# Patient Record
Sex: Female | Born: 1980 | Race: Black or African American | Hispanic: No | Marital: Married | Smoking: Never smoker
Health system: Southern US, Community
[De-identification: ages and names within clinical notes are randomized; demographics above are authoritative.]

## PROBLEM LIST (undated history)

## (undated) DIAGNOSIS — J45909 Unspecified asthma, uncomplicated: Secondary | ICD-10-CM

## (undated) DIAGNOSIS — O139 Gestational [pregnancy-induced] hypertension without significant proteinuria, unspecified trimester: Secondary | ICD-10-CM

## (undated) HISTORY — PX: FOOT SURGERY: SHX648

---
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
## 2006-08-08 ENCOUNTER — Other Ambulatory Visit: Admission: RE | Admit: 2006-08-08 | Discharge: 2006-08-08 | Payer: Self-pay | Admitting: Family Medicine

## 2006-11-19 ENCOUNTER — Ambulatory Visit (HOSPITAL_BASED_OUTPATIENT_CLINIC_OR_DEPARTMENT_OTHER): Admission: RE | Admit: 2006-11-19 | Discharge: 2006-11-19 | Payer: Self-pay | Admitting: Orthopedic Surgery

## 2008-04-29 ENCOUNTER — Encounter: Admission: RE | Admit: 2008-04-29 | Discharge: 2008-07-28 | Payer: Self-pay | Admitting: Family Medicine

## 2009-02-02 ENCOUNTER — Other Ambulatory Visit: Admission: RE | Admit: 2009-02-02 | Discharge: 2009-02-02 | Payer: Self-pay | Admitting: Family Medicine

## 2010-05-19 ENCOUNTER — Inpatient Hospital Stay (HOSPITAL_COMMUNITY)
Admission: AD | Admit: 2010-05-19 | Discharge: 2010-05-22 | DRG: 775 | Disposition: A | Discharge status: Home / Self Care | Payer: Medicaid Other | Source: Ambulatory Visit | Attending: Obstetrics and Gynecology | Admitting: Obstetrics and Gynecology

## 2010-05-19 DIAGNOSIS — D689 Coagulation defect, unspecified: Secondary | ICD-10-CM | POA: Diagnosis present

## 2010-05-19 DIAGNOSIS — O9912 Other diseases of the blood and blood-forming organs and certain disorders involving the immune mechanism complicating childbirth: Secondary | ICD-10-CM | POA: Diagnosis present

## 2010-05-19 DIAGNOSIS — O139 Gestational [pregnancy-induced] hypertension without significant proteinuria, unspecified trimester: Secondary | ICD-10-CM | POA: Diagnosis present

## 2010-05-19 DIAGNOSIS — D696 Thrombocytopenia, unspecified: Secondary | ICD-10-CM | POA: Diagnosis present

## 2010-05-19 DIAGNOSIS — O99892 Other specified diseases and conditions complicating childbirth: Secondary | ICD-10-CM | POA: Diagnosis present

## 2010-05-19 DIAGNOSIS — Z22330 Carrier of Group B streptococcus: Secondary | ICD-10-CM

## 2010-05-19 LAB — COMPREHENSIVE METABOLIC PANEL
ALT: 17 U/L (ref 0–35)
AST: 12 U/L (ref 0–37)
Albumin: 2.8 g/dL — ABNORMAL LOW (ref 3.5–5.2)
Alkaline Phosphatase: 111 U/L (ref 39–117)
BUN: 6 mg/dL (ref 6–23)
CO2: 24 mEq/L (ref 19–32)
Calcium: 9 mg/dL (ref 8.4–10.5)
Chloride: 102 mEq/L (ref 96–112)
Creatinine, Ser: 0.49 mg/dL (ref 0.4–1.2)
GFR calc Af Amer: >60 mL/min (ref >60)
GFR calc non Af Amer: >60 mL/min (ref >60)
Glucose, Bld: 103 mg/dL — ABNORMAL HIGH (ref 70–99)
Potassium: 3.6 mEq/L (ref 3.5–5.1)
Sodium: 137 mEq/L (ref 135–145)
Total Bilirubin: 0.5 mg/dL (ref 0.3–1.2)
Total Protein: 5.8 g/dL — ABNORMAL LOW (ref 6.0–8.3)

## 2010-05-19 LAB — CBC
HCT: 37.2 % (ref 36.0–46.0)
Hemoglobin: 13.2 g/dL (ref 12.0–15.0)
MCH: 27.8 pg (ref 26.0–34.0)
MCHC: 35.5 g/dL (ref 30.0–36.0)
MCV: 78.3 fL (ref 78.0–100.0)
Platelets: 96 10*3/uL — ABNORMAL LOW (ref 150–400)
RBC: 4.75 MIL/uL (ref 3.87–5.11)
RDW: 15.8 % — ABNORMAL HIGH (ref 11.5–15.5)
WBC: 11.2 10*3/uL — ABNORMAL HIGH (ref 4.0–10.5)

## 2010-05-19 LAB — URIC ACID: Uric Acid, Serum: 2.6 mg/dL (ref 2.4–7.0)

## 2010-05-19 LAB — LACTATE DEHYDROGENASE: LDH: 180 U/L (ref 94–250)

## 2010-05-20 ENCOUNTER — Other Ambulatory Visit: Payer: Self-pay | Admitting: Obstetrics and Gynecology

## 2010-05-20 LAB — CBC
HCT: 37.6 % (ref 36.0–46.0)
MCH: 27.7 pg (ref 26.0–34.0)
MCV: 78.3 fL (ref 78.0–100.0)
Platelets: 93 10*3/uL — ABNORMAL LOW (ref 150–400)
RDW: 16.1 % — ABNORMAL HIGH (ref 11.5–15.5)

## 2010-05-21 LAB — COMPREHENSIVE METABOLIC PANEL
AST: 17 U/L (ref 0–37)
Albumin: 2.5 g/dL — ABNORMAL LOW (ref 3.5–5.2)
BUN: 5 mg/dL — ABNORMAL LOW (ref 6–23)
CO2: 24 mEq/L (ref 19–32)
Calcium: 8.9 mg/dL (ref 8.4–10.5)
Chloride: 101 mEq/L (ref 96–112)
Creatinine, Ser: <0.47 mg/dL (ref 0.4–1.2)
Total Bilirubin: 0.5 mg/dL (ref 0.3–1.2)

## 2010-05-21 LAB — CBC
MCH: 27.7 pg (ref 26.0–34.0)
MCHC: 35.5 g/dL (ref 30.0–36.0)
MCV: 78.2 fL (ref 78.0–100.0)
Platelets: 94 10*3/uL — ABNORMAL LOW (ref 150–400)
RBC: 4.76 MIL/uL (ref 3.87–5.11)

## 2010-05-21 LAB — URIC ACID: Uric Acid, Serum: 2.3 mg/dL — ABNORMAL LOW (ref 2.4–7.0)

## 2010-05-22 LAB — COMPREHENSIVE METABOLIC PANEL
ALT: 15 U/L (ref 0–35)
AST: 18 U/L (ref 0–37)
Albumin: 2.4 g/dL — ABNORMAL LOW (ref 3.5–5.2)
Alkaline Phosphatase: 77 U/L (ref 39–117)
Chloride: 99 mEq/L (ref 96–112)
Potassium: 3.9 mEq/L (ref 3.5–5.1)
Sodium: 136 mEq/L (ref 135–145)
Total Bilirubin: 0.3 mg/dL (ref 0.3–1.2)
Total Protein: 5.5 g/dL — ABNORMAL LOW (ref 6.0–8.3)

## 2010-05-22 LAB — CBC
MCV: 78.3 fL (ref 78.0–100.0)
Platelets: 109 10*3/uL — ABNORMAL LOW (ref 150–400)
RBC: 4.15 MIL/uL (ref 3.87–5.11)
RDW: 16.1 % — ABNORMAL HIGH (ref 11.5–15.5)
WBC: 17.6 10*3/uL — ABNORMAL HIGH (ref 4.0–10.5)

## 2010-05-23 NOTE — Op Note (Signed)
NAMEMarland Kitchen  CELISA, SCHOENBERG NO.:  [id number]   MEDICAL RECORD NO.:  [id number]          PATIENT TYPE:  AMB   LOCATION:  [facility]                         FACILITY:  [facility]   PHYSICIAN:  Deidre Ala, M.D.    DATE OF BIRTH:  [birth year]   DATE OF PROCEDURE:  11/19/2006  DATE OF DISCHARGE:                               OPERATIVE REPORT   PREOPERATIVE DIAGNOSES:  1. Left foot dorsal proximal interphalangeal joint dislocation fourth      toe, closed.  2. Left fifth toe proximal phalanx fracture with mild to moderate      displacement at phalangeal base.  3. 1.76 cm superficial laceration flexion crease base of left fifth      toe.   POSTOPERATIVE DIAGNOSES:  1. Left foot dorsal proximal interphalangeal joint dislocation fourth      toe, closed.  2. Left fifth toe proximal phalanx fracture with mild to moderate      displacement at phalangeal base.  3. 1.76 cm superficial laceration flexion crease base of left fifth      toe.   PROCEDURE:  1. Attempt at closed with percutaneous pinning then open reduction      internal fixation, left fourth toe dorsal proximal interphalangeal      joint dislocation with pinning.  2. Attempt at closed with percutaneous pinning then open reduction      internal fixation left fifth toe proximal phalanx fracture with      pinning.  3. Closure 1.7-mm laceration base of left fifth toe with single-layer      closure and wound cleansing.  4. Use of intraoperative fluoro.   SURGEON:  Doristine Section, M.D.   ASSISTANT:  Phineas Semen, P.A.   ANESTHESIA:  General with LMA.   CULTURES:  None.   DRAINS:  None.   ESTIMATED BLOOD LOSS:  Minimal.   TOURNIQUET TIME:  Esmarch 1 hour 8 minutes.   PATHOLOGIC FINDINGS AND HISTORY:  Peyson was at home, fell out of bed,  leg was asleep and hit her toe against a leg of a chair.  She sustained  these fractures and dislocations, went to [facility] on  [address] where Dr. Chilton Si  attempted a closed reduction of the fourth  toe with local block and was unable to reduce it.  The laceration was  left open, thoroughly cleansed and she was placed on antibiotics.  She  then came to our office and was set up for surgery today.  At surgery we  got a reduction, but it was very unstable of the left fourth toe and it  was very difficult to pin without opening it under direct vision.  The  same was true of the left fifth toe fracture base and so both of them  were opened to dorsal extensor hood splitting approaches where anatomic  reduction was obtained and pinning carried out under direct vision and  confirmed by C-arm fluoroscopy with essentially anatomic reduction  obtained on the fifth and anatomic retained fifth toe proximal phalanx  and anatomic reduction obtained on the left fourth.  We also  cleansed of  the laceration which was single layer did not penetrate deeply to the  fracture site with 4-0 nylon vertical mattress sutures.  Intraoperative  fluoro was used to confirm positioning.   PROCEDURE:  With adequate anesthesia obtained using LMA technique, 1  gram Ancef given IV prophylaxis and another one at tourniquet let down.  The patient was placed in the supine position, left foot was prepped  from toes to the midcalf in the standard fashion.  After standard  prepping and draping, Esmarch examination was used and left on without  tourniquet on the thigh.  I then attempted closed reduction of the  fourth and pinning retrograde.  I then elected to go ahead with a  longitudinal incision over the PIP joint.  Incision was deepened sharply  with a knife and hemostasis obtained using Bovie electrocoagulator.  Under loupe magnification, the extensor tendon and hood was split  longitudinally.  The dislocation was reduced exactly.  The pin that we  had had retrograde into the distal and middle phalanx was pulled outward  just to the level of the joint.  I slightly flexed the  joint and then  drilled it again retrograde into the head of the proximal phalanx and  just up to the cortex.  This was in an anatomic position and it did hold  the joint into the proximal phalanx under direct vision.  C-arm  fluoroscopy confirmed this.  We then turned attention to the fifth toe  where attempted closed reduction and pinning was carried out.  I then  made a longitudinal incision over the base of the proximal phalanx,  split the extensor tendon, dissected the fracture, then took the pin, a  0.45 K-wire antegrade out the distal portion of the proximal phalanx  fracture, reduced the fracture and then pinned it retrograde through the  base of the proximal phalanx and the metatarsal head.  C-arm fluoroscopy  confirmed positioning under direct vision.  Irrigation was carried out.  Both wounds were then closed with extensor hoods and extensor tendon  split, closed with a running locking 4-0 Vicryl and the skin was closed  with running 4-0 nylon.  The pins were bent and cut and capped.  We then  also placed a intermetatarsal block 3, 4 and 5 with 0.5% Marcaine plain  and close the base of fifth toe laceration with interrupted vertical  mattress sutures of 4-0 nylon.  A bulky sterile compressive forefoot  dressing was applied.  The patient having procedure well was awakened,  taken to recovery room in satisfactory condition where she should  continue her home antibiotics for 5 days.  We will see her back on  Friday for recheck, weightbearing as tolerated on her heel, crutches,  given Percocet for pain.           ______________________________  V. Charlesetta Shanks, M.D.     VEP/MEDQ  D:  11/19/2006  T:  11/20/2006  Job:  [id number]   cc:   Dr. Rodman Key, [facility]

## 2010-05-26 ENCOUNTER — Inpatient Hospital Stay (HOSPITAL_COMMUNITY)
Admission: AD | Admit: 2010-05-26 | Discharge: 2010-05-26 | Disposition: A | Discharge status: Home / Self Care | Payer: Medicaid Other | Source: Ambulatory Visit | Attending: Obstetrics and Gynecology | Admitting: Obstetrics and Gynecology

## 2010-05-26 DIAGNOSIS — O135 Gestational [pregnancy-induced] hypertension without significant proteinuria, complicating the puerperium: Secondary | ICD-10-CM | POA: Insufficient documentation

## 2010-05-26 LAB — CBC
Hemoglobin: 12.6 g/dL (ref 12.0–15.0)
MCHC: 35.3 g/dL (ref 30.0–36.0)
WBC: 10.9 10*3/uL — ABNORMAL HIGH (ref 4.0–10.5)

## 2010-05-26 LAB — COMPREHENSIVE METABOLIC PANEL
ALT: 31 U/L (ref 0–35)
AST: 13 U/L (ref 0–37)
Alkaline Phosphatase: 89 U/L (ref 39–117)
CO2: 27 mEq/L (ref 19–32)
Calcium: 9.2 mg/dL (ref 8.4–10.5)
GFR calc Af Amer: >60 mL/min (ref >60)
GFR calc non Af Amer: >60 mL/min (ref >60)
Potassium: 4 mEq/L (ref 3.5–5.1)
Sodium: 136 mEq/L (ref 135–145)
Total Protein: 5.9 g/dL — ABNORMAL LOW (ref 6.0–8.3)

## 2010-05-26 LAB — URINALYSIS, ROUTINE W REFLEX MICROSCOPIC
Bilirubin Urine: NEGATIVE
Glucose, UA: NEGATIVE mg/dL
Ketones, ur: NEGATIVE mg/dL
Nitrite: NEGATIVE
Specific Gravity, Urine: 1.015 (ref 1.005–1.030)
pH: 7.5 (ref 5.0–8.0)

## 2010-05-26 LAB — URINE MICROSCOPIC-ADD ON

## 2010-05-28 ENCOUNTER — Inpatient Hospital Stay (HOSPITAL_COMMUNITY)
Admission: AD | Admit: 2010-05-28 | Discharge: 2010-05-28 | Disposition: A | Discharge status: Home / Self Care | Payer: Medicaid Other | Source: Ambulatory Visit | Attending: Obstetrics and Gynecology | Admitting: Obstetrics and Gynecology

## 2010-05-28 DIAGNOSIS — O135 Gestational [pregnancy-induced] hypertension without significant proteinuria, complicating the puerperium: Secondary | ICD-10-CM | POA: Insufficient documentation

## 2010-05-28 LAB — URINALYSIS, ROUTINE W REFLEX MICROSCOPIC
Glucose, UA: NEGATIVE mg/dL
Specific Gravity, Urine: 1.02 (ref 1.005–1.030)

## 2010-05-28 LAB — URINE MICROSCOPIC-ADD ON

## 2010-05-29 LAB — URINE CULTURE

## 2010-10-17 LAB — POCT HEMOGLOBIN-HEMACUE: Hemoglobin: 15.5 — ABNORMAL HIGH

## 2010-10-17 LAB — POCT PREGNANCY, URINE
Operator id: 268271
Preg Test, Ur: NEGATIVE

## 2012-04-30 ENCOUNTER — Emergency Department (INDEPENDENT_AMBULATORY_CARE_PROVIDER_SITE_OTHER)
Admission: EM | Admit: 2012-04-30 | Discharge: 2012-04-30 | Disposition: A | Discharge status: Home / Self Care | Payer: BC Managed Care – PPO | Source: Home / Self Care | Attending: Family Medicine | Admitting: Family Medicine

## 2012-04-30 ENCOUNTER — Encounter (HOSPITAL_COMMUNITY): Payer: Self-pay | Admitting: *Deleted

## 2012-04-30 ENCOUNTER — Emergency Department (INDEPENDENT_AMBULATORY_CARE_PROVIDER_SITE_OTHER): Payer: BC Managed Care – PPO

## 2012-04-30 DIAGNOSIS — B349 Viral infection, unspecified: Secondary | ICD-10-CM

## 2012-04-30 DIAGNOSIS — B9789 Other viral agents as the cause of diseases classified elsewhere: Secondary | ICD-10-CM

## 2012-04-30 DIAGNOSIS — J302 Other seasonal allergic rhinitis: Secondary | ICD-10-CM

## 2012-04-30 DIAGNOSIS — J309 Allergic rhinitis, unspecified: Secondary | ICD-10-CM

## 2012-04-30 HISTORY — DX: Gestational (pregnancy-induced) hypertension without significant proteinuria, unspecified trimester: O13.9

## 2012-04-30 HISTORY — DX: Unspecified asthma, uncomplicated: J45.909

## 2012-04-30 MED ORDER — BENZONATATE 100 MG PO CAPS: 100.0000 mg | ORAL_CAPSULE | Freq: Three times a day (TID) | ORAL | Status: DC | PRN

## 2012-04-30 MED ORDER — FLUTICASONE PROPIONATE 50 MCG/ACT NA SUSP: 2.0000 | Freq: Every day | NASAL | Status: DC

## 2012-04-30 NOTE — ED Provider Notes (Signed)
History     CSN: [id number]  Arrival date & time 04/30/12  1041   None     Chief Complaint  Patient presents with  . Cough    (Consider location/radiation/quality/duration/timing/severity/associated sxs/prior treatment) HPI Comments: Pt reports had GI illness 4/18-4/21 which has completely resolved although pt still doesn't feel like eating and isn't drinking enough.  4/21 developed cough and congestion. Pt thinks may be a combination of an illness and seasonal allergies. Isn't taking zyrtec at this time. Cough bothers her the most. Hasn't taken anything for illness today.   Patient is a 32 y.o. female presenting with cough. The history is provided by the patient.  Cough Cough characteristics:  Non-productive and hacking Severity:  Moderate Onset quality:  Gradual Duration:  2 days Timing:  Constant Progression:  Unchanged Chronicity:  New Smoker: no   Relieved by:  Nothing Worsened by:  Lying down Ineffective treatments: otc cold medicine. Associated symptoms: chills, ear fullness, fever, rhinorrhea, sinus congestion and sore throat   Associated symptoms: no chest pain and no wheezing   Associated symptoms comment:  Temp last night was 101   Past Medical History  Diagnosis Date  . Asthma   . Gestational hypertension     Past Surgical History  Procedure Laterality Date  . Foot surgery      History reviewed. No pertinent family history.  History  Substance Use Topics  . Smoking status: Never Smoker   . Smokeless tobacco: Not on file  . Alcohol Use: No    OB History   Grav Para Term Preterm Abortions TAB SAB Ect Mult Living                  Review of Systems  Constitutional: Positive for fever and chills.  HENT: Positive for congestion, sore throat, rhinorrhea, postnasal drip and sinus pressure.        Ear pressure  Respiratory: Positive for cough and chest tightness. Negative for wheezing.   Cardiovascular: Negative for chest pain.  Gastrointestinal:  Negative for nausea, vomiting, abdominal pain, diarrhea and constipation.       None at this time    Allergies  Review of patient's allergies indicates no known allergies.  Home Medications   Current Outpatient Rx  Name  Route  Sig  Dispense  Refill  . Cetirizine HCl (ZYRTEC PO)   Oral   Take by mouth.         . Pseudoeph-Doxylamine-DM-APAP (NYQUIL PO)   Oral   Take by mouth.         . benzonatate (TESSALON) 100 MG capsule   Oral   Take 1 capsule (100 mg total) by mouth 3 (three) times daily as needed for cough.   21 capsule   0   . fluticasone (FLONASE) 50 MCG/ACT nasal spray   Nasal   Place 2 sprays into the nose daily.   16 g   2     BP 117/75  Pulse 107  Temp(Src) 98.3 F (36.8 C) (Oral)  Resp 18  SpO2 100%  Physical Exam  Constitutional: She appears well-developed and well-nourished.  HENT:  Right Ear: Tympanic membrane, external ear and ear canal normal.  Left Ear: Tympanic membrane, external ear and ear canal normal.  Nose: Mucosal edema and rhinorrhea present. Right sinus exhibits maxillary sinus tenderness and frontal sinus tenderness. Left sinus exhibits maxillary sinus tenderness and frontal sinus tenderness.  Mouth/Throat: Oropharynx is clear and moist and mucous membranes are normal.  Cardiovascular: Regular rhythm.  Tachycardia present.   Pulmonary/Chest: Effort normal and breath sounds normal. No respiratory distress. She has no decreased breath sounds. She has no wheezes. She has no rhonchi. She has no rales.  Frequent coughing  Lymphadenopathy:       Head (right side): No submental, no submandibular and no tonsillar adenopathy present.       Head (left side): No submental, no submandibular and no tonsillar adenopathy present.    She has no cervical adenopathy.    ED Course  Procedures (including critical care time)  Labs Reviewed - No data to display Dg Chest 2 View  04/30/2012  *RADIOLOGY REPORT*  Clinical Data: Coughing.  Congestion.   Short of breath.  Fever.  CHEST - 2 VIEW  Comparison: None.  Findings:  Cardiopericardial silhouette within normal limits. Mediastinal contours normal. Trachea midline.  No airspace disease or effusion.  IMPRESSION: No active cardiopulmonary disease.   Original Report Authenticated By: Andreas Newport, M.D.      1. Seasonal allergies   2. Viral infection       MDM  rx flonase, advised to restart zyrtec, use saline spray. rx tessalon for cough. Work note.         Cathlyn Parsons, NP 04/30/12 [phone]

## 2012-04-30 NOTE — ED Notes (Signed)
Pt  Reports  Symptoms   Of  Cough     Productive     At times   -    As  Well  As  Congestion    -    Symptoms  Began  About  3  Days  Ago        -        She  Reports  Had    Symptoms  Of       Gi    Issues  sev  Days  Ago  But  That is  bertter   -  She  Has  A  History  Of  Asthma  In past   But  Does  Not  Use  Any  Inhalers      She  Is  At this  Time  Sitting upright on  Exam table  Speaking in  Complete  sentances

## 2012-05-01 NOTE — ED Provider Notes (Signed)
Medical screening examination/treatment/procedure(s) were performed by non-physician practitioner and as supervising physician I was immediately available for consultation/collaboration.   MORENO-COLL,Zorion Nims; MD  Estefany Goebel Moreno-Coll, MD 05/01/12 0924 

## 2012-09-27 ENCOUNTER — Emergency Department (HOSPITAL_COMMUNITY)
Admission: EM | Admit: 2012-09-27 | Discharge: 2012-09-27 | Disposition: A | Discharge status: Home / Self Care | Payer: Self-pay | Source: Home / Self Care

## 2012-09-27 ENCOUNTER — Encounter (HOSPITAL_COMMUNITY): Payer: Self-pay | Admitting: Emergency Medicine

## 2012-09-27 DIAGNOSIS — H5711 Ocular pain, right eye: Secondary | ICD-10-CM

## 2012-09-27 DIAGNOSIS — H53451 Other localized visual field defect, right eye: Secondary | ICD-10-CM | POA: Diagnosis present

## 2012-09-27 DIAGNOSIS — H547 Unspecified visual loss: Secondary | ICD-10-CM

## 2012-09-27 MED ORDER — KETOROLAC TROMETHAMINE 0.5 % OP SOLN: 1.0000 [drp] | Freq: Four times a day (QID) | OPHTHALMIC | Status: DC

## 2012-09-27 NOTE — ED Provider Notes (Signed)
CSN: [id number]     Arrival date & time 09/27/12  1115 History   None    Chief Complaint  Patient presents with  . Eye Problem   (Consider location/radiation/quality/duration/timing/severity/associated sxs/prior Treatment) HPI Comments: Patient presents today with right eye redness, pain and decreased vision. Onset Tuesday with "irritation" in the eye. No known exposures or FB to the eye is noted. Over the days to follow she noticed worsening pain, irritation and a "film" over the right eye that has progressed. She also notes some drainage to the right eye with "Crust" this am. Otherwise no fatigue, fever, chills, cough, congestion or exposures to illness.    Patient is a 32 y.o. female presenting with eye problem. The history is provided by the patient.  Eye Problem   Past Medical History  Diagnosis Date  . Asthma   . Gestational hypertension    Past Surgical History  Procedure Laterality Date  . Foot surgery     History reviewed. No pertinent family history. History  Substance Use Topics  . Smoking status: Never Smoker   . Smokeless tobacco: Not on file  . Alcohol Use: No   OB History   Grav Para Term Preterm Abortions TAB SAB Ect Mult Living                 Review of Systems  All other systems reviewed and are negative.    Allergies  Review of patient's allergies indicates no known allergies.  Home Medications   Current Outpatient Rx  Name  Route  Sig  Dispense  Refill  . benzonatate (TESSALON) 100 MG capsule   Oral   Take 1 capsule (100 mg total) by mouth 3 (three) times daily as needed for cough.   21 capsule   0   . Cetirizine HCl (ZYRTEC PO)   Oral   Take by mouth.         . fluticasone (FLONASE) 50 MCG/ACT nasal spray   Nasal   Place 2 sprays into the nose daily.   16 g   2   . ketorolac (ACULAR) 0.5 % ophthalmic solution   Right Eye   Place 1 drop into the right eye every 6 (six) hours.   5 mL   0   . Pseudoeph-Doxylamine-DM-APAP  (NYQUIL PO)   Oral   Take by mouth.          BP 139/88  Pulse 105  Temp(Src) 98.5 F (36.9 C) (Oral)  Resp 20  SpO2 98%  LMP 09/12/2012 Physical Exam  Nursing note and vitals reviewed. Constitutional: She appears well-developed and well-nourished. No distress.  HENT:  Head: Normocephalic and atraumatic.  Eyes:  Right pupil sluggish to light, sclera injection, no abrasion is observed. Conjunctiva appears inflammed but not beefy red. No drainage is noted. Further opthalmic evaluation difficult to evaluate.   Skin: Skin is warm and dry.  Psychiatric: Her behavior is normal.    ED Course  Procedures (including critical care time) Labs Review Labs Reviewed - No data to display Imaging Review No results found.  MDM   1. Eye pain, right   2. Decreased vision   Discussed with Dr. Artis Flock and then spoke with Dr. Delaney Meigs. He will contact patient later today with an appt. In the interim Will use Ketoralac drops 3-4 times daily. Patient is educated and will wait for f/u.     Azucena Fallen, PA-C 09/27/12 1315

## 2012-09-27 NOTE — ED Notes (Signed)
C/o right eye irritation which she first noticed on Tuesday.  Allergy medication and visine used but no relief.   States i started itching and burning.   Warm cloth was used.

## 2012-10-01 NOTE — ED Provider Notes (Signed)
Medical screening examination/treatment/procedure(s) were performed by resident physician or non-physician practitioner and as supervising physician I was immediately available for consultation/collaboration.   Barkley Bruns MD.   Linna Hoff, MD 10/01/12 [phone]

## 2014-03-19 IMAGING — CR DG CHEST 2V
1 series · 1 of 1 positions shown · non-contrast
Comparison: None.

CLINICAL DATA: Coughing.  Congestion.  Short of breath.  Fever.

CHEST - 2 VIEW

[view not recorded]
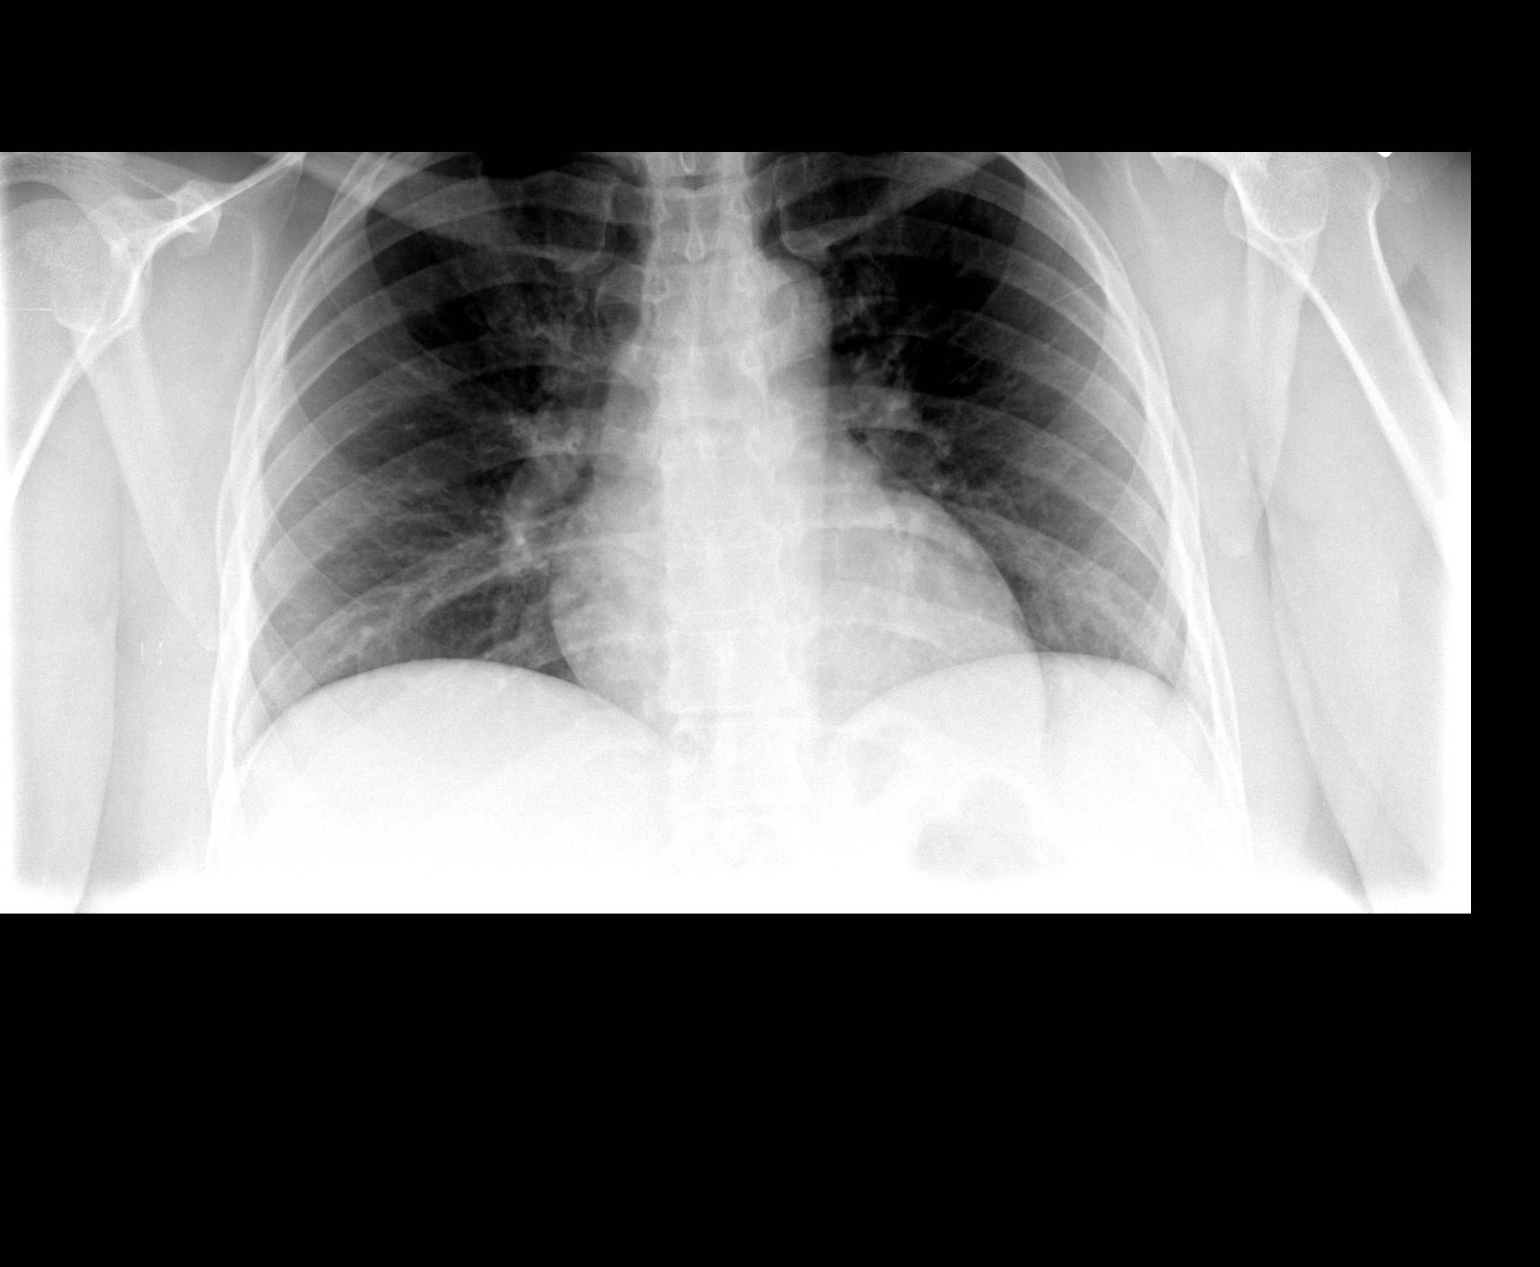

[1 of 1 positions shown; findings below may reference images not displayed]

FINDINGS: Cardiopericardial silhouette within normal limits.
Mediastinal contours normal. Trachea midline.  No airspace disease
or effusion.
IMPRESSION: No active cardiopulmonary disease.

## 2014-07-26 ENCOUNTER — Encounter (HOSPITAL_COMMUNITY): Payer: Self-pay | Admitting: Emergency Medicine

## 2014-07-26 ENCOUNTER — Emergency Department (HOSPITAL_COMMUNITY)
Admission: EM | Admit: 2014-07-26 | Discharge: 2014-07-26 | Disposition: A | Discharge status: Home / Self Care | Payer: Self-pay | Attending: Emergency Medicine | Admitting: Emergency Medicine

## 2014-07-26 DIAGNOSIS — J45909 Unspecified asthma, uncomplicated: Secondary | ICD-10-CM | POA: Insufficient documentation

## 2014-07-26 DIAGNOSIS — Z7951 Long term (current) use of inhaled steroids: Secondary | ICD-10-CM | POA: Insufficient documentation

## 2014-07-26 DIAGNOSIS — S4992XA Unspecified injury of left shoulder and upper arm, initial encounter: Secondary | ICD-10-CM | POA: Insufficient documentation

## 2014-07-26 DIAGNOSIS — S76119A Strain of unspecified quadriceps muscle, fascia and tendon, initial encounter: Secondary | ICD-10-CM

## 2014-07-26 DIAGNOSIS — Z8679 Personal history of other diseases of the circulatory system: Secondary | ICD-10-CM | POA: Insufficient documentation

## 2014-07-26 DIAGNOSIS — S76111A Strain of right quadriceps muscle, fascia and tendon, initial encounter: Secondary | ICD-10-CM | POA: Insufficient documentation

## 2014-07-26 DIAGNOSIS — S76112A Strain of left quadriceps muscle, fascia and tendon, initial encounter: Secondary | ICD-10-CM | POA: Insufficient documentation

## 2014-07-26 DIAGNOSIS — Y998 Other external cause status: Secondary | ICD-10-CM | POA: Insufficient documentation

## 2014-07-26 DIAGNOSIS — Y9389 Activity, other specified: Secondary | ICD-10-CM | POA: Insufficient documentation

## 2014-07-26 DIAGNOSIS — Y92410 Unspecified street and highway as the place of occurrence of the external cause: Secondary | ICD-10-CM | POA: Insufficient documentation

## 2014-07-26 MED ORDER — NAPROXEN 500 MG PO TABS: 500.0000 mg | ORAL_TABLET | Freq: Two times a day (BID) | ORAL | Status: DC

## 2014-07-26 MED ORDER — METHOCARBAMOL 500 MG PO TABS: 500.0000 mg | ORAL_TABLET | Freq: Two times a day (BID) | ORAL | Status: DC

## 2014-07-26 MED ORDER — TRAMADOL HCL 50 MG PO TABS
50.0000 mg | ORAL_TABLET | Freq: Four times a day (QID) | ORAL | Status: DC | PRN
Start: 2014-07-26 — End: 2021-07-08

## 2014-07-26 NOTE — ED Notes (Signed)
Declined W/C at D/C and was escorted to lobby by RN. 
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
## 2015-06-13 ENCOUNTER — Other Ambulatory Visit (HOSPITAL_COMMUNITY)
Admission: RE | Admit: 2015-06-13 | Discharge: 2015-06-13 | Disposition: A | Discharge status: Home / Self Care | Payer: No Typology Code available for payment source | Source: Ambulatory Visit | Attending: Obstetrics and Gynecology | Admitting: Obstetrics and Gynecology

## 2015-06-13 ENCOUNTER — Other Ambulatory Visit: Payer: Self-pay | Admitting: Obstetrics and Gynecology

## 2015-06-13 DIAGNOSIS — Z01419 Encounter for gynecological examination (general) (routine) without abnormal findings: Secondary | ICD-10-CM | POA: Insufficient documentation

## 2015-06-13 DIAGNOSIS — Z1151 Encounter for screening for human papillomavirus (HPV): Secondary | ICD-10-CM | POA: Insufficient documentation

## 2015-06-14 LAB — CYTOLOGY - PAP

## 2019-07-27 ENCOUNTER — Ambulatory Visit: Payer: Self-pay | Attending: Internal Medicine

## 2019-07-27 DIAGNOSIS — Z23 Encounter for immunization: Secondary | ICD-10-CM

## 2019-07-27 NOTE — Progress Notes (Signed)
   Covid-[facility] Vaccination [facility]  Name:  Veronica Schmitt    MRN: [id number] DOB: Oct 10, 1980  07/27/2019  Veronica Schmitt was observed post Covid-19 immunization for 15 minutes without incident. She was provided with Vaccine Information Sheet and instruction to access the V-Safe system.   Veronica Schmitt was instructed to call 911 with any severe reactions post vaccine: Marland Kitchen Difficulty breathing  . Swelling of face and throat  . A fast heartbeat  . A bad rash all over body  . Dizziness and weakness   Immunizations Administered    Name Date Dose VIS Date Route   Pfizer COVID-19 Vaccine 07/27/2019  4:57 PM 0.3 mL 03/04/2018 Intramuscular   Manufacturer: [employer], [employer]   Lot: [id number]   NDC: [address]-9

## 2019-08-18 ENCOUNTER — Ambulatory Visit: Payer: Self-pay | Attending: Internal Medicine

## 2019-08-18 DIAGNOSIS — Z23 Encounter for immunization: Secondary | ICD-10-CM

## 2019-08-18 NOTE — Progress Notes (Signed)
° °  Covid-19 Vaccination Clinic  Name:  Veronica Schmitt    MRN: [id number] DOB: 05/07/1980  08/18/2019  Veronica Schmitt was observed post Covid-19 immunization for 15 minutes without incident. She was provided with Vaccine Information Sheet and instruction to access the V-Safe system.   Veronica Schmitt was instructed to call 911 with any severe reactions post vaccine:  Difficulty breathing   Swelling of face and throat   A fast heartbeat   A bad rash all over body   Dizziness and weakness   Immunizations Administered    Name Date Dose VIS Date Route   Pfizer COVID-19 Vaccine 08/18/2019  9:25 AM 0.3 mL 03/04/2018 Intramuscular   Manufacturer: [employer], [employer]   Lot: [id number]   NDC: [address]-9

## 2019-12-25 DIAGNOSIS — H52223 Regular astigmatism, bilateral: Secondary | ICD-10-CM | POA: Diagnosis not present

## 2021-07-08 ENCOUNTER — Ambulatory Visit
Admission: EM | Admit: 2021-07-08 | Discharge: 2021-07-08 | Disposition: A | Discharge status: Home / Self Care | Payer: BC Managed Care – PPO | Attending: Urgent Care | Admitting: Urgent Care

## 2021-07-08 ENCOUNTER — Encounter: Payer: Self-pay | Admitting: Emergency Medicine

## 2021-07-08 DIAGNOSIS — J209 Acute bronchitis, unspecified: Secondary | ICD-10-CM

## 2021-07-08 DIAGNOSIS — H1013 Acute atopic conjunctivitis, bilateral: Secondary | ICD-10-CM

## 2021-07-08 DIAGNOSIS — J3081 Allergic rhinitis due to animal (cat) (dog) hair and dander: Secondary | ICD-10-CM

## 2021-07-08 MED ORDER — MONTELUKAST SODIUM 10 MG PO TABS: 10.0000 mg | ORAL_TABLET | Freq: Every day | ORAL | 0 refills | Status: DC

## 2021-07-08 MED ORDER — PREDNISONE 10 MG (21) PO TBPK: ORAL_TABLET | Freq: Every day | ORAL | 0 refills | Status: DC

## 2021-07-08 MED ORDER — ALBUTEROL SULFATE HFA 108 (90 BASE) MCG/ACT IN AERS: 1.0000 | INHALATION_SPRAY | Freq: Four times a day (QID) | RESPIRATORY_TRACT | 0 refills | Status: AC | PRN

## 2021-07-08 MED ORDER — PAZEO 0.7 % OP SOLN: 1.0000 [drp] | Freq: Every day | OPHTHALMIC | 0 refills | Status: DC

## 2021-07-08 MED ORDER — LEVOCETIRIZINE DIHYDROCHLORIDE 5 MG PO TABS: 5.0000 mg | ORAL_TABLET | Freq: Every evening | ORAL | 0 refills | Status: DC

## 2021-07-08 NOTE — ED Triage Notes (Signed)
Pt here with possible allergy to a new dog. Presents with productive cough, sore throat, some SOB, eye drainage x 1 week (same day pt got dog). Pt has had reactions to dogs before.

## 2021-07-08 NOTE — Discharge Instructions (Addendum)
Your symptoms are related to bronchitis.  This can be viral in nature, but may also be related to the dog. Please start taking 2 puffs of albuterol every 6 hours as needed for cough and wheezing.  Stop your over-the-counter Zyrtec, switch to Xyzal nightly. Start taking montelukast nightly. Start a prednisone taper as prescribed, take in the morning to prevent insomnia at night. Use the eye drops once daily as needed for irritation. Please only take your Mucinex if it is plain guaifenesin.  If it has Mucinex DM, or dextromethorphan or Sudafed inside, please stop it. If your symptoms return after completion of the medications, consider rehoming the dog or allergy treatments. Should you develop any worsening shortness of breath, fever, or new symptoms, return to clinic.

## 2021-07-08 NOTE — ED Provider Notes (Signed)
[facility]    CSN: [id number] Arrival date & time: 07/08/21  1015      History   Chief Complaint Chief Complaint  Patient presents with   Allergic Reaction   Sore Throat   Shortness of Breath   Eye Problem   Nasal Congestion   Cough    HPI Yeraldi Fidler is a 41 y.o. female.   Pleasant 41yo female presents today with concerns of sore throat, cough, chest congestion, nasal congestion, runny eyes, sneezing and thick mucous production since one week ago. 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 Just purchased a hypoallergenic dog last week when all symptoms started. She has been taking OTC zyrtec, flonase, and cough/cold preparations without any relief. States she is coughing up white and green phlegm, no blood. No fevers. Does have prior hx of asthma, states she is feeling tight in the chest. C/o itchy draining eyes that were crusted this morning. Denies throat swelling or dysphagia. Denies CP or palps.   Allergic Reaction Sore Throat Associated symptoms include shortness of breath.  Shortness of Breath Associated symptoms: cough   Eye Problem Cough Associated symptoms: shortness of breath     Past Medical History:  Diagnosis Date   Asthma    Gestational hypertension     Patient Active Problem List   Diagnosis Date Noted   Decreased peripheral vision of right eye 09/27/2012    Past Surgical History:  Procedure Laterality Date   FOOT SURGERY      OB History   No obstetric history on file.      Home Medications    Prior to Admission medications   Medication Sig Start Date End Date Taking? Authorizing Provider  albuterol (VENTOLIN HFA) 108 (90 Base) MCG/ACT inhaler Inhale 1-2 puffs into the lungs every 6 (six) hours as needed for wheezing or shortness of breath. 07/08/21  Yes Alvia Jablonski L, PA  levocetirizine (XYZAL) 5 MG tablet Take 1 tablet (5 mg total) by mouth every evening. 07/08/21  Yes Evanne Matsunaga L, PA  montelukast (SINGULAIR) 10 MG  tablet Take 1 tablet (10 mg total) by mouth at bedtime. 07/08/21  Yes Ilaisaane Marts L, PA  Olopatadine HCl (PAZEO) 0.7 % SOLN Apply 1 drop to eye daily. 07/08/21  Yes Lesta Limbert L, PA  predniSONE (STERAPRED UNI-PAK 21 TAB) 10 MG (21) TBPK tablet Take by mouth daily. Take 6 tabs by mouth daily  for 1 days, then 5 tabs for 1 days, then 4 tabs for 1 days, then 3 tabs for 1 days, 2 tabs for 1 days, then 1 tab by mouth daily for 1 days 07/08/21  Yes Ashtyn Freilich L, PA  fluticasone (FLONASE) 50 MCG/ACT nasal spray Place 2 sprays into the nose daily. 04/30/12   Cathlyn Parsons, NP  ketorolac (ACULAR) 0.5 % ophthalmic solution Place 1 drop into the right eye every 6 (six) hours. 09/27/12   Riki Sheer, PA-C    Family History History reviewed. No pertinent family history.  Social History Social History   Tobacco Use   Smoking status: Never  Substance Use Topics   Alcohol use: No     Allergies   Patient has no known allergies.   Review of Systems Review of Systems  Respiratory:  Positive for cough and shortness of breath.   As per HPI   Physical Exam Triage Vital Signs ED Triage Vitals  Enc Vitals Group     BP 07/08/21 1029 137/90     Pulse Rate  07/08/21 1029 94     Resp 07/08/21 1029 20     Temp 07/08/21 1029 98.6 F (37 C)     Temp src --      SpO2 07/08/21 1029 98 %     Weight --      Height --      Head Circumference --      Peak Flow --      Pain Score 07/08/21 1032 0     Pain Loc --      Pain Edu? --      Excl. in GC? --    No data found.  Updated Vital Signs BP 137/90   Pulse 94   Temp 98.6 F (37 C)   Resp 20   SpO2 98%   Visual Acuity Right Eye Distance:   Left Eye Distance:   Bilateral Distance:    Right Eye Near:   Left Eye Near:    Bilateral Near:     Physical Exam Vitals and nursing note reviewed.  Constitutional:      General: She is not in acute distress.    Appearance: She is well-developed and normal weight. She is not  toxic-appearing or diaphoretic.  HENT:     Head: Normocephalic and atraumatic.     Right Ear: Ear canal normal. No drainage, swelling or tenderness. A middle ear effusion is present. Tympanic membrane is not erythematous.     Left Ear: Ear canal normal. No drainage, swelling or tenderness. A middle ear effusion is present. Tympanic membrane is not erythematous.     Nose: Congestion and rhinorrhea present.     Right Turbinates: Swollen.     Left Turbinates: Swollen.     Right Sinus: No maxillary sinus tenderness or frontal sinus tenderness.     Left Sinus: No maxillary sinus tenderness or frontal sinus tenderness.     Mouth/Throat:     Mouth: Mucous membranes are moist. No oral lesions.     Pharynx: Oropharynx is clear. Uvula midline. No pharyngeal swelling, oropharyngeal exudate, posterior oropharyngeal erythema or uvula swelling.     Tonsils: No tonsillar exudate or tonsillar abscesses.  Eyes:     General: Lids are everted, no foreign bodies appreciated. No scleral icterus.       Right eye: No foreign body or discharge.        Left eye: No foreign body or discharge.     Conjunctiva/sclera:     Right eye: Right conjunctiva is injected. No chemosis, exudate or hemorrhage.    Left eye: Left conjunctiva is injected. No chemosis, exudate or hemorrhage.    Comments: Clear, tearing  Cardiovascular:     Rate and Rhythm: Normal rate and regular rhythm.     Pulses: Normal pulses.     Heart sounds: Normal heart sounds. No murmur heard. Pulmonary:     Effort: Pulmonary effort is normal. No respiratory distress.     Breath sounds: No stridor. Wheezing (minimal bibasilar) present. No rhonchi or rales.  Chest:     Chest wall: No tenderness.  Musculoskeletal:     Cervical back: Normal range of motion and neck supple. No rigidity or tenderness.  Lymphadenopathy:     Cervical: No cervical adenopathy.  Skin:    General: Skin is warm and dry.     Capillary Refill: Capillary refill takes less than 2  seconds.     Coloration: Skin is not jaundiced.     Findings: No bruising, erythema or rash.  Neurological:  General: No focal deficit present.     Mental Status: She is alert.  Psychiatric:        Mood and Affect: Mood normal.        Behavior: Behavior normal.      UC Treatments / Results  Labs (all labs ordered are listed, but only abnormal results are displayed) Labs Reviewed - No data to display  EKG   Radiology No results found.  Procedures Procedures (including critical care time)  Medications Ordered in UC Medications - No data to display  Initial Impression / Assessment and Plan / UC Course  I have reviewed the triage vital signs and the nursing notes.  Pertinent labs & imaging results that were available during my care of the patient were reviewed by me and considered in my medical decision making (see chart for details).     Acute bronchitis - vs reactive airway disease. Pt does have known hx of asthma. Will start montelukast and ventolin. Pred dose pack called in.  Allergic rhinitis - flonase. Consider sinus flushes. Stop cetirizine and switch to xyzal Allergic conjunctivitis - pazeo eye drops as needed.  Final Clinical Impressions(s) / UC Diagnoses   Final diagnoses:  Acute bronchitis, unspecified organism  Allergic rhinitis due to animal hair and dander  Allergic conjunctivitis of both eyes     Discharge Instructions      Your symptoms are related to bronchitis.  This can be viral in nature, but may also be related to the dog. Please start taking 2 puffs of albuterol every 6 hours as needed for cough and wheezing.  Stop your over-the-counter Zyrtec, switch to Xyzal nightly. Start taking montelukast nightly. Start a prednisone taper as prescribed, take in the morning to prevent insomnia at night. Use the eye drops once daily as needed for irritation. Please only take your Mucinex if it is plain guaifenesin.  If it has Mucinex DM, or  dextromethorphan or Sudafed inside, please stop it. If your symptoms return after completion of the medications, consider rehoming the dog or allergy treatments. Should you develop any worsening shortness of breath, fever, or new symptoms, return to clinic.      ED Prescriptions     Medication Sig Dispense Auth. Provider   levocetirizine (XYZAL) 5 MG tablet Take 1 tablet (5 mg total) by mouth every evening. 14 tablet Seriah Brotzman L, PA   predniSONE (STERAPRED UNI-PAK 21 TAB) 10 MG (21) TBPK tablet Take by mouth daily. Take 6 tabs by mouth daily  for 1 days, then 5 tabs for 1 days, then 4 tabs for 1 days, then 3 tabs for 1 days, 2 tabs for 1 days, then 1 tab by mouth daily for 1 days 21 tablet Kimberlie Csaszar L, PA   albuterol (VENTOLIN HFA) 108 (90 Base) MCG/ACT inhaler Inhale 1-2 puffs into the lungs every 6 (six) hours as needed for wheezing or shortness of breath. 8 g Sunya Humbarger L, PA   montelukast (SINGULAIR) 10 MG tablet Take 1 tablet (10 mg total) by mouth at bedtime. 30 tablet Deneisha Dade L, PA   Olopatadine HCl (PAZEO) 0.7 % SOLN Apply 1 drop to eye daily. 2.5 mL Marvine Encalade L, PA      PDMP not reviewed this encounter.   Maretta Bees, [place] 07/08/21 1224

## 2021-09-03 NOTE — Progress Notes (Unsigned)
New Patient Note  RE: [place]aka MRN: [id number] DOB: Sep 11, 1980 Date of Office Visit: 09/04/2021  Consult requested by: Daisy Floro, MD Primary care provider: Daisy Floro, MD  Chief Complaint: No chief complaint on file.  History of Present Illness: I had the pleasure of seeing Geralda Jones-Pough for initial evaluation at the [facility] on 09/03/2021. She is a 41 y.o. female, who is referred here by Daisy Floro, MD for the evaluation of ***.  She reports symptoms of ***. Symptoms have been going on for *** years. The symptoms are present *** all year around with worsening in ***. Other triggers include exposure to ***. Anosmia: ***. Headache: ***. She has used *** with ***fair improvement in symptoms. Sinus infections: ***. Previous work up includes: ***. Previous ENT evaluation: ***. Previous sinus imaging: ***. History of nasal polyps: ***. Last eye exam: ***. History of reflux: ***.  Assessment and Plan: Casandra is a 41 y.o. female with: No problem-specific Assessment & Plan notes found for this encounter.  No follow-ups on file.  No orders of the defined types were placed in this encounter.  Lab Orders  No laboratory test(s) ordered today    Other allergy screening: Asthma: {Blank single:19197::"yes","no"} Rhino conjunctivitis: {Blank single:19197::"yes","no"} Food allergy: {Blank single:19197::"yes","no"} Medication allergy: {Blank single:19197::"yes","no"} Hymenoptera allergy: {Blank single:19197::"yes","no"} Urticaria: {Blank single:19197::"yes","no"} Eczema:{Blank single:19197::"yes","no"} History of recurrent infections suggestive of immunodeficency: {Blank single:19197::"yes","no"}  Diagnostics: Spirometry:  Tracings reviewed. Her effort: {Blank single:19197::"Good reproducible efforts.","It was hard to get consistent efforts and there is a question as to whether this reflects a maximal maneuver.","Poor  effort, data can not be interpreted."} FVC: ***L FEV1: ***L, ***% predicted FEV1/FVC ratio: ***% Interpretation: {Blank single:19197::"Spirometry consistent with mild obstructive disease","Spirometry consistent with moderate obstructive disease","Spirometry consistent with severe obstructive disease","Spirometry consistent with possible restrictive disease","Spirometry consistent with mixed obstructive and restrictive disease","Spirometry uninterpretable due to technique","Spirometry consistent with normal pattern","No overt abnormalities noted given today's efforts"}.  Please see scanned spirometry results for details.  Skin Testing: {Blank single:19197::"Select foods","Environmental allergy panel","Environmental allergy panel and select foods","Food allergy panel","None","Deferred due to recent antihistamines use"}. *** Results discussed with patient/family.   Past Medical History: Patient Active Problem List   Diagnosis Date Noted  . Decreased peripheral vision of right eye 09/27/2012   Past Medical History:  Diagnosis Date  . Asthma   . Gestational hypertension    Past Surgical History: Past Surgical History:  Procedure Laterality Date  . FOOT SURGERY     Medication List:  Current Outpatient Medications  Medication Sig Dispense Refill  . albuterol (VENTOLIN HFA) 108 (90 Base) MCG/ACT inhaler Inhale 1-2 puffs into the lungs every 6 (six) hours as needed for wheezing or shortness of breath. 8 g 0  . fluticasone (FLONASE) 50 MCG/ACT nasal spray Place 2 sprays into the nose daily. 16 g 2  . ketorolac (ACULAR) 0.5 % ophthalmic solution Place 1 drop into the right eye every 6 (six) hours. 5 mL 0  . levocetirizine (XYZAL) 5 MG tablet Take 1 tablet (5 mg total) by mouth every evening. 14 tablet 0  . montelukast (SINGULAIR) 10 MG tablet Take 1 tablet (10 mg total) by mouth at bedtime. 30 tablet 0  . Olopatadine HCl (PAZEO) 0.7 % SOLN Apply 1 drop to eye daily. 2.5 mL 0  . predniSONE  (STERAPRED UNI-PAK 21 TAB) 10 MG (21) TBPK tablet Take by mouth daily. Take 6 tabs by mouth daily  for 1 days, then 5 tabs for  1 days, then 4 tabs for 1 days, then 3 tabs for 1 days, 2 tabs for 1 days, then 1 tab by mouth daily for 1 days 21 tablet 0   No current facility-administered medications for this visit.   Allergies: No Known Allergies Social History: Social History   Socioeconomic History  . Marital status: Married    Spouse name: Not on file  . Number of children: Not on file  . Years of education: Not on file  . Highest education level: Not on file  Occupational History  . Not on file  Tobacco Use  . Smoking status: Never  . Smokeless tobacco: Not on file  Substance and Sexual Activity  . Alcohol use: No  . Drug use: Not on file  . Sexual activity: Not on file  Other Topics Concern  . Not on file  Social History Narrative  . Not on file   Social Determinants of Health   Financial Resource Strain: Not on file  Food Insecurity: Not on file  Transportation Needs: Not on file  Physical Activity: Not on file  Stress: Not on file  Social Connections: Not on file   Lives in a ***. Smoking: *** Occupation: ***  Environmental HistorySurveyor, minerals in the house: Copywriter, advertising in the family room: {Blank single:19197::"yes","no"} Carpet in the bedroom: {Blank single:19197::"yes","no"} Heating: {Blank single:19197::"electric","gas","heat pump"} Cooling: {Blank single:19197::"central","window","heat pump"} Pet: {Blank single:19197::"yes ***","no"}  Family History: No family history on file. Problem                               Relation Asthma                                   *** Eczema                                *** Food allergy                          *** Allergic rhino conjunctivitis     ***  Review of Systems  Constitutional:  Negative for appetite change, chills, fever and unexpected weight change.  HENT:  Negative  for congestion and rhinorrhea.   Eyes:  Negative for itching.  Respiratory:  Negative for cough, chest tightness, shortness of breath and wheezing.   Cardiovascular:  Negative for chest pain.  Gastrointestinal:  Negative for abdominal pain.  Genitourinary:  Negative for difficulty urinating.  Skin:  Negative for rash.  Neurological:  Negative for headaches.   Objective: There were no vitals taken for this visit. There is no height or weight on file to calculate BMI. Physical Exam Vitals and nursing note reviewed.  Constitutional:      Appearance: Normal appearance. She is well-developed.  HENT:     Head: Normocephalic and atraumatic.     Right Ear: Tympanic membrane and external ear normal.     Left Ear: Tympanic membrane and external ear normal.     Nose: Nose normal.     Mouth/Throat:     Mouth: Mucous membranes are moist.     Pharynx: Oropharynx is clear.  Eyes:     Conjunctiva/sclera: Conjunctivae normal.  Cardiovascular:     Rate and Rhythm: Normal rate and regular rhythm.  Heart sounds: Normal heart sounds. No murmur heard.    No friction rub. No gallop.  Pulmonary:     Effort: Pulmonary effort is normal.     Breath sounds: Normal breath sounds. No wheezing, rhonchi or rales.  Musculoskeletal:     Cervical back: Neck supple.  Skin:    General: Skin is warm.     Findings: No rash.  Neurological:     Mental Status: She is alert and oriented to person, place, and time.  Psychiatric:        Behavior: Behavior normal.  The plan was reviewed with the patient/family, and all questions/concerned were addressed.  It was my pleasure to see Veronica Schmitt today and participate in her care. Please feel free to contact me with any questions or concerns.  Sincerely,  Rexene Alberts, DO Allergy & Immunology  [facility] office: [place] office: [phone]

## 2021-09-04 ENCOUNTER — Ambulatory Visit (INDEPENDENT_AMBULATORY_CARE_PROVIDER_SITE_OTHER): Payer: BC Managed Care – PPO | Admitting: Allergy

## 2021-09-04 ENCOUNTER — Encounter: Payer: Self-pay | Admitting: Allergy

## 2021-09-04 VITALS — BP 118/72 | HR 84 | Temp 98.6°F | Resp 18 | Ht 62.0 in | Wt 186.2 lb

## 2021-09-04 DIAGNOSIS — H1013 Acute atopic conjunctivitis, bilateral: Secondary | ICD-10-CM | POA: Diagnosis not present

## 2021-09-04 DIAGNOSIS — J4520 Mild intermittent asthma, uncomplicated: Secondary | ICD-10-CM | POA: Diagnosis not present

## 2021-09-04 DIAGNOSIS — J3089 Other allergic rhinitis: Secondary | ICD-10-CM | POA: Diagnosis not present

## 2021-09-04 MED ORDER — RYALTRIS 665-25 MCG/ACT NA SUSP: 1.0000 | Freq: Two times a day (BID) | NASAL | 5 refills | Status: AC

## 2021-09-04 MED ORDER — OLOPATADINE HCL 0.2 % OP SOLN: 1.0000 [drp] | Freq: Every day | OPHTHALMIC | 5 refills | Status: AC | PRN

## 2021-09-04 MED ORDER — MONTELUKAST SODIUM 10 MG PO TABS: 10.0000 mg | ORAL_TABLET | Freq: Every day | ORAL | 5 refills | Status: AC

## 2021-09-04 NOTE — Assessment & Plan Note (Signed)
.   See assessment and plan as above. 

## 2021-09-04 NOTE — Patient Instructions (Addendum)
Today's skin testing showed: Positive to grass, weed, ragweed, trees, cat, dog and dust mites.  Results given.  Environmental allergies Start environmental control measures as below. Use over the counter antihistamines such as Zyrtec (cetirizine), Claritin (loratadine), Allegra (fexofenadine), or Xyzal (levocetirizine) daily as needed. May take twice a day during allergy flares. May switch antihistamines every few months. Start Ryaltris (olopatadine + mometasone nasal spray combination) 1-2 sprays per nostril twice a day. Sample given. This replaces your other nasal sprays. If this works well for you, then have [facility] ship the medication to your home - prescription already sent in.  Start Singulair (montelukast) 10mg  daily at night. Cautioned that in some children/adults can experience behavioral changes including hyperactivity, agitation, depression, sleep disturbances and suicidal ideations. These side effects are rare, but if you notice them you should notify me and discontinue Singulair (montelukast). Use olopatadine eye drops 0.2% once a day as needed for itchy/watery eyes. Allergy injections - let know when ready to start. Check with insurance company regarding coverage. Will send in Epipen once you have the first shot appointment scheduled. Had a detailed discussion with patient/family that clinical history is suggestive of allergic rhinitis, and may benefit from allergy immunotherapy (AIT). Discussed in detail regarding the dosing, schedule, side effects (mild to moderate local allergic reaction and rarely systemic allergic reactions including anaphylaxis), and benefits (significant improvement in nasal symptoms, seasonal flares of asthma) of immunotherapy with the patient. There is significant time commitment involved with allergy shots, which includes weekly immunotherapy injections for first 9-12 months and then biweekly to monthly injections for 3-5 years. Consent was  signed.  Breathing Daily controller medication(s): start Singulair (montelukast) 10mg  daily at night. May use albuterol rescue inhaler 2 puffs every 4 to 6 hours as needed for shortness of breath, chest tightness, coughing, and wheezing. Monitor frequency of use.  Breathing control goals:  Full participation in all desired activities (may need albuterol before activity) Albuterol use two times or less a week on average (not counting use with activity) Cough interfering with sleep two times or less a month Oral steroids no more than once a year No hospitalizations   Follow up in 2 months or sooner if needed.  Our [place] office is moving in September 2023 to a new location. New address: [address] [place], [place], [place] (white building). [place] office: [phone] (same phone number).   Reducing Pollen Exposure Pollen seasons: trees (spring), grass (summer) and ragweed/weeds (fall). Keep windows closed in your home and car to lower pollen exposure.  Install air conditioning in the bedroom and throughout the house if possible.  Avoid going out in dry windy days - especially early morning. Pollen counts are highest between 5 - 10 AM and on dry, hot and windy days.  Save outside activities for late afternoon or after a heavy rain, when pollen levels are lower.  Avoid mowing of grass if you have grass pollen allergy. Be aware that pollen can also be transported indoors on people and pets.  Dry your clothes in an automatic dryer rather than hanging them outside where they might collect pollen.  Rinse hair and eyes before bedtime.  Pet Allergen Avoidance: Contrary to popular opinion, there are no "hypoallergenic" breeds of dogs or cats. That is because people are not allergic to an animal's hair, but to an allergen found in the animal's saliva, dander (dead skin flakes) or urine. Pet allergy symptoms typically occur within minutes. For some people, symptoms can build up and  become  most severe 8 to 12 hours after contact with the animal. People with severe allergies can experience reactions in public places if dander has been transported on the pet owners' clothing. Keeping an animal outdoors is only a partial solution, since homes with pets in the yard still have higher concentrations of animal allergens. Before getting a pet, ask your allergist to determine if you are allergic to animals. If your pet is already considered part of your family, try to minimize contact and keep the pet out of the bedroom and other rooms where you spend a great deal of time. As with dust mites, vacuum carpets often or replace carpet with a hardwood floor, tile or linoleum. High-efficiency particulate air (HEPA) cleaners can reduce allergen levels over time. While dander and saliva are the source of cat and dog allergens, urine is the source of allergens from rabbits, hamsters, mice and [place] pigs; so ask a non-allergic family member to clean the animal's cage. If you have a pet allergy, talk to your allergist about the potential for allergy immunotherapy (allergy shots). This strategy can often provide long-term relief.  Control of House Dust Mite Allergen Dust mite allergens are a common trigger of allergy and asthma symptoms. While they can be found throughout the house, these microscopic creatures thrive in warm, humid environments such as bedding, upholstered furniture and carpeting. Because so much time is spent in the bedroom, it is essential to reduce mite levels there.  Encase pillows, mattresses, and box springs in special allergen-proof fabric covers or airtight, zippered plastic covers.  Bedding should be washed weekly in hot water (130 F) and dried in a hot dryer. Allergen-proof covers are available for comforters and pillows that can't be regularly washed.  Wash the allergy-proof covers every few months. Minimize clutter in the bedroom. Keep pets out of the bedroom.  Keep humidity  less than 50% by using a dehumidifier or air conditioning. You can buy a humidity measuring device called a hygrometer to monitor this.  If possible, replace carpets with hardwood, linoleum, or washable area rugs. If that's not possible, vacuum frequently with a vacuum that has a HEPA filter. Remove all upholstered furniture and non-washable window drapes from the bedroom. Remove all non-washable stuffed toys from the bedroom.  Wash stuffed toys weekly.

## 2021-09-04 NOTE — Assessment & Plan Note (Signed)
No prior asthma diagnosis however had to use albuterol when around the dog.  Still using 1 puff daily as she is hesitant about stopping it.   Today's spirometry shows some restriction with 5% improvement in FEV1 post bronchodilator treatment.  Clinically feeling improved. . Daily controller medication(s): start Singulair (montelukast) 10mg  daily at night. . May use albuterol rescue inhaler 2 puffs every 4 to 6 hours as needed for shortness of breath, chest tightness, coughing, and wheezing. Monitor frequency of use.  . Get spirometry at next visit. . If not controlled with above regimen, will add on ICS inhaler next.

## 2021-09-04 NOTE — Assessment & Plan Note (Signed)
Rhinoconjunctivitis symptoms mainly in the spring however she had worsening symptoms today to trouble breathing when they got a new dog.  She had to be treated with prednisone, albuterol, antihistamines and eyedrops.  Returned the dog but considering getting a new one in the future.  Today's skin testing showed: Positive to grass, weed, ragweed, trees, cat, dog and dust mites.  Start environmental control measures as below.  Use over the counter antihistamines such as Zyrtec (cetirizine), Claritin (loratadine), Allegra (fexofenadine), or Xyzal (levocetirizine) daily as needed. May take twice a day during allergy flares. May switch antihistamines every few months.  Start Ryaltris (olopatadine + mometasone nasal spray combination) 1-2 sprays per nostril twice a day. Sample given.  This replaces your other nasal sprays.  If this works well for you, then have [facility] ship the medication to your home - prescription already sent in.   Start Singulair (montelukast) 10mg  daily at night.  Cautioned that in some children/adults can experience behavioral changes including hyperactivity, agitation, depression, sleep disturbances and suicidal ideations. These side effects are rare, but if you notice them you should notify me and discontinue Singulair (montelukast).  Use olopatadine eye drops 0.2% once a day as needed for itchy/watery eyes.  Allergy injections - let know when ready to start.  Check with insurance company regarding coverage - 2 injections.   Will send in Epipen once you have the first shot appointment scheduled.  Had a detailed discussion with patient/family that clinical history is suggestive of allergic rhinitis, and may benefit from allergy immunotherapy (AIT). Discussed in detail regarding the dosing, schedule, side effects (mild to moderate local allergic reaction and rarely systemic allergic reactions including anaphylaxis), and benefits (significant improvement in nasal  symptoms, seasonal flares of asthma) of immunotherapy with the patient. There is significant time commitment involved with allergy shots, which includes weekly immunotherapy injections for first 9-12 months and then biweekly to monthly injections for 3-5 years. Consent was signed.

## 2022-07-05 ENCOUNTER — Other Ambulatory Visit: Payer: Self-pay | Admitting: Obstetrics and Gynecology

## 2022-07-05 DIAGNOSIS — N644 Mastodynia: Secondary | ICD-10-CM

## 2022-08-09 ENCOUNTER — Ambulatory Visit
Admission: RE | Admit: 2022-08-09 | Discharge: 2022-08-09 | Disposition: A | Discharge status: Home / Self Care | Payer: BC Managed Care – PPO | Source: Ambulatory Visit | Attending: Obstetrics and Gynecology | Admitting: Obstetrics and Gynecology

## 2022-08-09 ENCOUNTER — Ambulatory Visit: Payer: BC Managed Care – PPO

## 2022-08-09 DIAGNOSIS — N644 Mastodynia: Secondary | ICD-10-CM

## 2023-04-29 ENCOUNTER — Encounter: Payer: Self-pay | Admitting: Allergy & Immunology

## 2023-04-29 ENCOUNTER — Other Ambulatory Visit: Payer: Self-pay

## 2023-04-29 ENCOUNTER — Ambulatory Visit (INDEPENDENT_AMBULATORY_CARE_PROVIDER_SITE_OTHER): Admitting: Allergy & Immunology

## 2023-04-29 VITALS — BP 110/80 | HR 93 | Temp 98.9°F | Resp 17 | Ht 63.0 in | Wt 191.9 lb

## 2023-04-29 DIAGNOSIS — L500 Allergic urticaria: Secondary | ICD-10-CM

## 2023-04-29 DIAGNOSIS — J3089 Other allergic rhinitis: Secondary | ICD-10-CM | POA: Diagnosis not present

## 2023-04-29 DIAGNOSIS — J302 Other seasonal allergic rhinitis: Secondary | ICD-10-CM | POA: Diagnosis not present

## 2023-04-29 DIAGNOSIS — J4520 Mild intermittent asthma, uncomplicated: Secondary | ICD-10-CM | POA: Diagnosis not present

## 2023-04-29 MED ORDER — LEVOCETIRIZINE DIHYDROCHLORIDE 5 MG PO TABS: 5.0000 mg | ORAL_TABLET | Freq: Two times a day (BID) | ORAL | 1 refills | Status: DC

## 2023-04-29 MED ORDER — NEFFY 2 MG/0.1ML NA SOLN: 1.0000 | NASAL | 1 refills | Status: DC | PRN

## 2023-04-29 NOTE — Addendum Note (Signed)
 Addended by: Merwyn Achilles on: 04/29/2023 05:14 PM   Modules accepted: Orders

## 2023-04-29 NOTE — Progress Notes (Signed)
 FOLLOW UP  Date of Service/Encounter:  04/29/23   Assessment:   Perennial and seasonal allergic rhinitis (grasses, weeds, ragweed, trees, cat, dog, dust mites)  Mild intermittent asthma, uncomplicated  Allergic urticaria   Plan/Recommendations:   1. Seasonal and perennial allergic rhinitis (grasses, weeds, ragweed, trees, cat, dog, dust mites) - Try to limit the Afrin use since it can cause rebound congestion. - It is ok to use levocetirizine 2-3 times daily. - Strongly consider doing allergy  shots for long term control.  - CPT codes provided. - Check with your insurance company and call us  when you make a decision.    2. Mild intermittent asthma, uncomplicated - Lung testing looked great today. - We are not going to make any medication changes at this point in time.  - Continue with albuterol  as needed.   3. Return in about 6 months (around 10/29/2023). You can have the follow up appointment with Dr. Idolina Schmitt or a Nurse Practicioner (our Nurse Practitioners are excellent and always have Physician oversight!).   Subjective:   Veronica Schmitt is a 43 y.o. female presenting today for follow up of  Chief Complaint  Patient presents with   Follow-up    Allergies--using xzyal, taking 2-3 tablets at one time, has relief. Has gotten really bad over the last 2 years. Asthma-has gotten better. No inhaler use    Veronica Schmitt has a history of the following: Patient Active Problem List   Diagnosis Date Noted   Other allergic rhinitis 09/04/2021   Allergic conjunctivitis of both eyes 09/04/2021   Mild intermittent asthma without complication 09/04/2021   Decreased peripheral vision of right eye 09/27/2012    History obtained from: chart review and patient.  Discussed the use of AI scribe software for clinical note transcription with the patient and/or guardian, who gave verbal consent to proceed.  Veronica Schmitt is a 43 y.o. female presenting for a follow up visit.   She was last seen in August 2023.  At that time, she was started on Ryaltris  1 to 2 sprays per nostril twice daily as well as Singulair  10 mg daily.  She was also continued on an over-the-counter antihistamine 1-2 times daily as needed.  Allergy  shots were discussed and she was given call when she made a decision.  For her asthma, her spirometry showed some mild restriction.  She was continued on albuterol  as needed and montelukast  was added daily.   Since last visit, she has continued to have issues with her allergies.  Asthma/Respiratory Symptom History: She has a history of using albuterol  but has stopped due to the high cost. She does not currently use it.   Allergic Rhinitis Symptom History: She uses a nasal spray - Afrin - every other day due to nasal congestion, which she describes as causing 'rebound congestion'. She does not use other nasal sprays like Flonase  or Ryaltris . 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 She no longer has a dog due to these severe reactions.   Skin Symptom History: She experiences significant itching and hives, which are not well-controlled with the standard dose of levocetirizine. She finds that taking two or three tablets at night provides better symptom relief. She has previously tried montelukast  without success. She has a history of eczema, which she manages by avoiding gluten. Her skin condition improved significantly after adopting a gluten-free diet. She uses shea butter for moisturizing.  She works from  home and lives about 25 minutes away from the clinic. She has a 50 year old daughter who is homeschooled through a Insurance risk surveyor. Her daughter does not have allergies.   Otherwise, there have been no changes to her past medical history, surgical history, family history, or social history.    Review of systems otherwise negative other than that  mentioned in the HPI.    Objective:   Blood pressure 110/80, pulse 93, temperature 98.9 F (37.2 C), temperature source Temporal, resp. rate 17, height 5\' 3"  (1.6 m), weight 191 lb 14.4 oz (87 kg), SpO2 96%. Body mass index is 33.99 kg/m.    Physical Exam Constitutional:      Appearance: She is well-developed.  HENT:     Head: Normocephalic and atraumatic.     Right Ear: Tympanic membrane, ear canal and external ear normal.     Left Ear: Tympanic membrane, ear canal and external ear normal.     Nose: No nasal deformity, septal deviation, mucosal edema or rhinorrhea.     Right Turbinates: Not enlarged or swollen.     Left Turbinates: Not enlarged or swollen.     Right Sinus: No maxillary sinus tenderness or frontal sinus tenderness.     Left Sinus: No maxillary sinus tenderness or frontal sinus tenderness.     Mouth/Throat:     Mouth: Mucous membranes are not pale and not dry.     Pharynx: Uvula midline.  Eyes:     General: Lids are normal. No allergic shiner.       Right eye: No discharge.        Left eye: No discharge.     Conjunctiva/sclera: Conjunctivae normal.     Right eye: Right conjunctiva is not injected. No chemosis.    Left eye: Left conjunctiva is not injected. No chemosis.    Pupils: Pupils are equal, round, and reactive to light.  Cardiovascular:     Rate and Rhythm: Normal rate and regular rhythm.     Heart sounds: Normal heart sounds.  Pulmonary:     Effort: Pulmonary effort is normal. No tachypnea, accessory muscle usage or respiratory distress.     Breath sounds: Normal breath sounds. No wheezing, rhonchi or rales.  Chest:     Chest wall: No tenderness.  Lymphadenopathy:     Cervical: No cervical adenopathy.  Skin:    Coloration: Skin is not pale.     Findings: No abrasion, erythema, petechiae or rash. Rash is not papular, urticarial or vesicular.  Neurological:     Mental Status: She is alert.  Psychiatric:        Behavior: Behavior is  cooperative.      Diagnostic studies:    Spirometry: results normal (FEV1: 2.21/91%, FVC: 2.82/95%, FEV1/FVC: 78%).    Spirometry consistent with normal pattern.   Allergy  Studies: none      Veronica Gentles, MD  [facility] 

## 2023-04-29 NOTE — Patient Instructions (Addendum)
 1. Seasonal and perennial allergic rhinitis (grasses, weeds, ragweed, trees, cat, dog, dust mites) - Try to limit the Afrin use since it can cause rebound congestion. - It is ok to use levocetirizine 2-3 times daily. - Strongly consider doing allergy  shots for long term control.  - CPT codes provided. - Check with your insurance company and call us  when you make a decision.    2. Mild intermittent asthma, uncomplicated - Lung testing looked great today. - We are not going to make any medication changes at this point in time.  - Continue with albuterol  as needed.   3. Return in about 6 months (around 10/29/2023). You can have the follow up appointment with Dr. Idolina Maker or a Nurse Practicioner (our Nurse Practitioners are excellent and always have Physician oversight!).    Please inform us  of any Emergency Department visits, hospitalizations, or changes in symptoms. Call us  before going to the ED for breathing or allergy  symptoms since we might be able to fit you in for a sick visit. Feel free to contact us  anytime with any questions, problems, or concerns.  It was a pleasure to meet you today!  Websites that have reliable patient information: 1. American Academy of Asthma, Allergy , and Immunology: www.aaaai.org 2. Food Allergy  Research and Education (FARE): foodallergy.org 3. Mothers of Asthmatics: http://www.asthmacommunitynetwork.org 4. American College of Allergy , Asthma, and Immunology: www.acaai.org      "Like" us  on Facebook and Instagram for our latest updates!      A healthy democracy works best when [employer] participate! Make sure you are registered to vote! If you have moved or changed any of your contact information, you will need to get this updated before voting! Scan the QR codes below to learn more!      Allergy  Shots  Allergies are the result of a chain reaction that starts in the immune system. Your immune system controls how your body defends itself. For  instance, if you have an allergy  to pollen, your immune system identifies pollen as an invader or allergen. Your immune system overreacts by producing antibodies called Immunoglobulin E (IgE). These antibodies travel to cells that release chemicals, causing an allergic reaction.  The concept behind allergy  immunotherapy, whether it is received in the form of shots or tablets, is that the immune system can be desensitized to specific allergens that trigger allergy  symptoms. Although it requires time and patience, the payback can be long-term relief. Allergy  injections contain a dilute solution of those substances that you are allergic to based upon your skin testing and allergy  history.   How Do Allergy  Shots Work?  Allergy  shots work much like a vaccine. Your body responds to injected amounts of a particular allergen given in increasing doses, eventually developing a resistance and tolerance to it. Allergy  shots can lead to decreased, minimal or no allergy  symptoms.  There generally are two phases: build-up and maintenance. Build-up often ranges from three to six months and involves receiving injections with increasing amounts of the allergens. The shots are typically given once or twice a week, though more rapid build-up schedules are sometimes used.  The maintenance phase begins when the most effective dose is reached. This dose is different for each person, depending on how allergic you are and your response to the build-up injections. Once the maintenance dose is reached, there are longer periods between injections, typically two to four weeks.  Occasionally doctors give cortisone-type shots that can temporarily reduce allergy  symptoms. These types of shots are different  and should not be confused with allergy  immunotherapy shots.  Who Can Be Treated with Allergy  Shots?  Allergy  shots may be a good treatment approach for people with allergic rhinitis (hay fever), allergic asthma, conjunctivitis  (eye allergy ) or stinging insect allergy .   Before deciding to begin allergy  shots, you should consider:   The length of allergy  season and the severity of your symptoms  Whether medications and/or changes to your environment can control your symptoms  Your desire to avoid long-term medication use  Time: allergy  immunotherapy requires a major time commitment  Cost: may vary depending on your insurance coverage  Allergy  shots for children age 31 and older are effective and often well tolerated. They might prevent the onset of new allergen sensitivities or the progression to asthma.  Allergy  shots are not started on patients who are pregnant but can be continued on patients who become pregnant while receiving them. In some patients with other medical conditions or who take certain common medications, allergy  shots may be of risk. It is important to mention other medications you talk to your allergist.   What are the two types of build-ups offered:   RUSH or Rapid Desensitization -- one day of injections lasting from 8:30-4:30pm, injections every 1 hour.  Approximately half of the build-up process is completed in that one day.  The following week, normal build-up is resumed, and this entails ~16 visits either weekly or twice weekly, until reaching your "maintenance dose" which is continued weekly until eventually getting spaced out to every month for a duration of 3 to 5 years. The regular build-up appointments are nurse visits where the injections are administered, followed by required monitoring for 30 minutes.    Traditional build-up -- weekly visits for 6 -12 months until reaching "maintenance dose", then continue weekly until eventually spacing out to every 4 weeks as above. At these appointments, the injections are administered, followed by required monitoring for 30 minutes.     Either way is acceptable, and both are equally effective. With the rush protocol, the advantage is that less  time is spent here for injections overall AND you would also reach maintenance dosing faster (which is when the clinical benefit starts to become more apparent). Not everyone is a candidate for rapid desensitization.   IF we proceed with the RUSH protocol, there are premedications which must be taken the day before and the day after the rush only (this includes antihistamines, steroids, and Singulair ).  After the rush day, no prednisone  or Singulair  is required, and we just recommend antihistamines taken on your injection day.  What Is An Estimate of the Costs?  If you are interested in starting allergy  injections, please check with your insurance company about your coverage for both allergy  vial sets and allergy  injections.  Please do so prior to making the appointment to start injections.  The following are CPT codes to give to your insurance company. These are the amounts we BILL to the insurance company, but the amount YOU WILL PAY and WE RECEIVE IS SUBSTANTIALLY LESS and depends on the contracts we have with different insurance companies.   Amount Billed to Insurance One allergy  vial set  CPT 95165   $ 1200     One injection   CPT 95115   $ 35  RUSH (Rapid Desensitization) CPT 95180 x 8 hours  $500/hour  Regarding the allergy  injections, your co-pay may or may not apply with each injection, so please confirm this with your insurance company.  When you start allergy  injections, 1 or 2 sets of vials are made based on your allergies.  Not all patients can be on one set of vials. A set of vials lasts 6 months to a year depending on how quickly you can proceed with your build-up of your allergy  injections. Vials are personalized for each patient depending on their specific allergens.  How often are allergy  injection given during the build-up period?   Injections are given at least weekly during the build-up period until your maintenance dose is achieved. Per the doctor's discretion, you may have the  option of getting allergy  injections two times per week during the build-up period. However, there must be at least 48 hours between injections. The build-up period is usually completed within 6-12 months depending on your ability to schedule injections and for adjustments for reactions. When maintenance dose is reached, your injection schedule is gradually changed to every two weeks and later to every three weeks. Injections will then continue every 4 weeks. Usually, injections are continued for a total of 3-5 years.   When Will I Feel Better?  Some may experience decreased allergy  symptoms during the build-up phase. For others, it may take as long as 12 months on the maintenance dose. If there is no improvement after a year of maintenance, your allergist will discuss other treatment options with you.  If you aren't responding to allergy  shots, it may be because there is not enough dose of the allergen in your vaccine or there are missing allergens that were not identified during your allergy  testing. Other reasons could be that there are high levels of the allergen in your environment or major exposure to non-allergic triggers like tobacco smoke.  What Is the Length of Treatment?  Once the maintenance dose is reached, allergy  shots are generally continued for three to five years. The decision to stop should be discussed with your allergist at that time. Some people may experience a permanent reduction of allergy  symptoms. Others may relapse and a longer course of allergy  shots can be considered.  What Are the Possible Reactions?  The two types of adverse reactions that can occur with allergy  shots are local and systemic. Common local reactions include very mild redness and swelling at the injection site, which can happen immediately or several hours after. Report a delayed reaction from your last injection. These include arm swelling or runny nose, watery eyes or cough that occurs within 12-24 hours  after injection. A systemic reaction, which is less common, affects the entire body or a particular body system. They are usually mild and typically respond quickly to medications. Signs include increased allergy  symptoms such as sneezing, a stuffy nose or hives.   Rarely, a serious systemic reaction called anaphylaxis can develop. Symptoms include swelling in the throat, wheezing, a feeling of tightness in the chest, nausea or dizziness. Most serious systemic reactions develop within 30 minutes of allergy  shots. This is why it is strongly recommended you wait in your doctor's office for 30 minutes after your injections. Your allergist is trained to watch for reactions, and his or her staff is trained and equipped with the proper medications to identify and treat them.   Report to the nurse immediately if you experience any of the following symptoms: swelling, itching or redness of the skin, hives, watery eyes/nose, breathing difficulty, excessive sneezing, coughing, stomach pain, diarrhea, or light headedness. These symptoms may occur within 15-20 minutes after injection and may require medication.   Who  Should Administer Allergy  Shots?  The preferred location for receiving shots is your prescribing allergist's office. Injections can sometimes be given at another facility where the physician and staff are trained to recognize and treat reactions, and have received instructions by your prescribing allergist.  What if I am late for an injection?   Injection dose will be adjusted depending upon how many days or weeks you are late for your injection.   What if I am sick?   Please report any illness to the nurse before receiving injections. She may adjust your dose or postpone injections depending on your symptoms. If you have fever, flu, sinus infection or chest congestion it is best to postpone allergy  injections until you are better. Never get an allergy  injection if your asthma is causing you  problems. If your symptoms persist, seek out medical care to get your health problem under control.  What If I am or Become Pregnant:  Women that become pregnant should schedule an appointment with [facility] before receiving any further allergy  injections.

## 2023-04-30 DIAGNOSIS — J3089 Other allergic rhinitis: Secondary | ICD-10-CM | POA: Diagnosis not present

## 2023-04-30 NOTE — Progress Notes (Signed)
 Aeroallergen Immunotherapy  Ordering Provider: Dr. Drexel Gentles  Patient Details Name: Veronica Schmitt MRN: [id number] Date of Birth: [birth year]  Order 1 of 1  Vial Label: G/RW/W/T/C/D/DM  0.3 ml (Volume)  BAU Concentration -- [place] 10,000 0.2 ml (Volume)  1:20 Concentration -- [place] 0.6 ml (Volume)  1:20 Concentration -- Ragweed Mix 0.5 ml (Volume)  1:20 Concentration -- Weed Mix* 0.5 ml (Volume)  1:20 Concentration -- [place] 10 Tree Mix (also Sweet Gum) 0.5 ml (Volume)  1:10 Concentration -- Cat Hair 0.8 ml (Volume)  1:10 Concentration -- Dog Epithelia 0.7 ml (Volume)   AU Concentration -- Mite Mix (DF 5,000 & DP 5,000)   4.1  ml Extract Subtotal 0.9  ml Diluent 5.0  ml Maintenance Total  Schedule:  B  Blue Vial (1:100,000): Schedule B (6 doses) Yellow Vial (1:10,000): Schedule B (6 doses) Green Vial (1:1,000): Schedule B (6 doses) Red Vial (1:100): Schedule B (6 doses)  Special Instructions: After completion of the first Red Vial, please space to every two weeks. After completion of the second Red Vial, please space to every 4 weeks. Ok to up dose new vials at 0.65mL --> 0.3 mL --> 0.5 mL. Ok to come twice weekly, if desired, as long as there is 48 hours between injections.

## 2023-04-30 NOTE — Progress Notes (Signed)
 VIAL SET MADE 04-30-23. EXP 04-29-24

## 2023-05-21 ENCOUNTER — Ambulatory Visit (INDEPENDENT_AMBULATORY_CARE_PROVIDER_SITE_OTHER)

## 2023-05-21 DIAGNOSIS — J309 Allergic rhinitis, unspecified: Secondary | ICD-10-CM | POA: Diagnosis not present

## 2023-05-21 MED ORDER — EPINEPHRINE 0.3 MG/0.3ML IJ SOAJ: 0.3000 mg | INTRAMUSCULAR | 1 refills | Status: AC | PRN

## 2023-05-21 NOTE — Progress Notes (Signed)
 Immunotherapy   Patient Details  Name: Veronica Schmitt MRN: [id number] Date of Birth: [birth year]  05/21/2023  Veronica Schmitt started injections for Pollen-Pet-DM. Patient received 0.05 ml  of her blue vial. Patient waited 30 minutes with no problems.  Following schedule: B  Frequency:1 time per week Epi-Pen:Epi-Pen Available  Consent signed and patient instructions given.   Denton Flakes 05/21/2023, 1:14 PM

## 2023-05-31 ENCOUNTER — Ambulatory Visit (INDEPENDENT_AMBULATORY_CARE_PROVIDER_SITE_OTHER)

## 2023-05-31 DIAGNOSIS — J309 Allergic rhinitis, unspecified: Secondary | ICD-10-CM

## 2023-06-12 ENCOUNTER — Ambulatory Visit (INDEPENDENT_AMBULATORY_CARE_PROVIDER_SITE_OTHER): Payer: Self-pay

## 2023-06-12 DIAGNOSIS — J309 Allergic rhinitis, unspecified: Secondary | ICD-10-CM | POA: Diagnosis not present

## 2023-06-24 ENCOUNTER — Ambulatory Visit (INDEPENDENT_AMBULATORY_CARE_PROVIDER_SITE_OTHER): Payer: Self-pay

## 2023-06-24 DIAGNOSIS — J309 Allergic rhinitis, unspecified: Secondary | ICD-10-CM | POA: Diagnosis not present

## 2023-07-05 ENCOUNTER — Ambulatory Visit (INDEPENDENT_AMBULATORY_CARE_PROVIDER_SITE_OTHER)

## 2023-07-05 DIAGNOSIS — J309 Allergic rhinitis, unspecified: Secondary | ICD-10-CM

## 2023-07-18 ENCOUNTER — Ambulatory Visit

## 2023-07-18 DIAGNOSIS — J309 Allergic rhinitis, unspecified: Secondary | ICD-10-CM

## 2023-07-30 ENCOUNTER — Ambulatory Visit (INDEPENDENT_AMBULATORY_CARE_PROVIDER_SITE_OTHER)

## 2023-07-30 DIAGNOSIS — J309 Allergic rhinitis, unspecified: Secondary | ICD-10-CM

## 2023-08-07 ENCOUNTER — Ambulatory Visit (INDEPENDENT_AMBULATORY_CARE_PROVIDER_SITE_OTHER)

## 2023-08-07 DIAGNOSIS — J309 Allergic rhinitis, unspecified: Secondary | ICD-10-CM | POA: Diagnosis not present

## 2023-08-20 ENCOUNTER — Ambulatory Visit (INDEPENDENT_AMBULATORY_CARE_PROVIDER_SITE_OTHER): Payer: Self-pay

## 2023-08-20 ENCOUNTER — Ambulatory Visit

## 2023-08-20 DIAGNOSIS — J309 Allergic rhinitis, unspecified: Secondary | ICD-10-CM | POA: Diagnosis not present

## 2023-08-30 ENCOUNTER — Ambulatory Visit

## 2023-08-30 DIAGNOSIS — J309 Allergic rhinitis, unspecified: Secondary | ICD-10-CM

## 2023-09-04 ENCOUNTER — Ambulatory Visit (INDEPENDENT_AMBULATORY_CARE_PROVIDER_SITE_OTHER)

## 2023-09-04 DIAGNOSIS — J309 Allergic rhinitis, unspecified: Secondary | ICD-10-CM | POA: Diagnosis not present

## 2023-09-13 ENCOUNTER — Ambulatory Visit (INDEPENDENT_AMBULATORY_CARE_PROVIDER_SITE_OTHER)

## 2023-09-13 DIAGNOSIS — J309 Allergic rhinitis, unspecified: Secondary | ICD-10-CM

## 2023-09-24 ENCOUNTER — Ambulatory Visit (INDEPENDENT_AMBULATORY_CARE_PROVIDER_SITE_OTHER)

## 2023-09-24 DIAGNOSIS — J309 Allergic rhinitis, unspecified: Secondary | ICD-10-CM

## 2023-10-07 ENCOUNTER — Ambulatory Visit (INDEPENDENT_AMBULATORY_CARE_PROVIDER_SITE_OTHER)

## 2023-10-07 DIAGNOSIS — J309 Allergic rhinitis, unspecified: Secondary | ICD-10-CM | POA: Diagnosis not present

## 2023-10-15 ENCOUNTER — Ambulatory Visit

## 2023-10-15 DIAGNOSIS — J309 Allergic rhinitis, unspecified: Secondary | ICD-10-CM | POA: Diagnosis not present

## 2023-10-24 ENCOUNTER — Other Ambulatory Visit: Payer: Self-pay | Admitting: Allergy & Immunology

## 2023-10-25 ENCOUNTER — Ambulatory Visit

## 2023-10-25 DIAGNOSIS — J309 Allergic rhinitis, unspecified: Secondary | ICD-10-CM | POA: Diagnosis not present

## 2023-10-29 ENCOUNTER — Ambulatory Visit: Admitting: Allergy & Immunology

## 2023-11-01 ENCOUNTER — Ambulatory Visit

## 2023-11-01 DIAGNOSIS — J309 Allergic rhinitis, unspecified: Secondary | ICD-10-CM

## 2023-11-12 ENCOUNTER — Ambulatory Visit (INDEPENDENT_AMBULATORY_CARE_PROVIDER_SITE_OTHER)

## 2023-11-12 DIAGNOSIS — J309 Allergic rhinitis, unspecified: Secondary | ICD-10-CM | POA: Diagnosis not present

## 2023-11-22 ENCOUNTER — Ambulatory Visit (INDEPENDENT_AMBULATORY_CARE_PROVIDER_SITE_OTHER): Admitting: *Deleted

## 2023-11-22 DIAGNOSIS — J309 Allergic rhinitis, unspecified: Secondary | ICD-10-CM | POA: Diagnosis not present

## 2023-11-28 ENCOUNTER — Ambulatory Visit (INDEPENDENT_AMBULATORY_CARE_PROVIDER_SITE_OTHER)

## 2023-11-28 DIAGNOSIS — J309 Allergic rhinitis, unspecified: Secondary | ICD-10-CM

## 2023-12-10 ENCOUNTER — Ambulatory Visit

## 2023-12-10 DIAGNOSIS — J309 Allergic rhinitis, unspecified: Secondary | ICD-10-CM

## 2023-12-23 ENCOUNTER — Ambulatory Visit

## 2023-12-23 DIAGNOSIS — J309 Allergic rhinitis, unspecified: Secondary | ICD-10-CM | POA: Diagnosis not present

## 2023-12-31 ENCOUNTER — Ambulatory Visit

## 2023-12-31 DIAGNOSIS — J309 Allergic rhinitis, unspecified: Secondary | ICD-10-CM | POA: Diagnosis not present

## 2024-01-07 ENCOUNTER — Ambulatory Visit (INDEPENDENT_AMBULATORY_CARE_PROVIDER_SITE_OTHER)

## 2024-01-07 DIAGNOSIS — J309 Allergic rhinitis, unspecified: Secondary | ICD-10-CM

## 2024-01-14 ENCOUNTER — Ambulatory Visit (INDEPENDENT_AMBULATORY_CARE_PROVIDER_SITE_OTHER)

## 2024-01-14 DIAGNOSIS — J302 Other seasonal allergic rhinitis: Secondary | ICD-10-CM

## 2024-01-23 ENCOUNTER — Ambulatory Visit: Admitting: *Deleted

## 2024-01-23 DIAGNOSIS — J302 Other seasonal allergic rhinitis: Secondary | ICD-10-CM

## 2024-01-28 ENCOUNTER — Ambulatory Visit

## 2024-01-28 DIAGNOSIS — J302 Other seasonal allergic rhinitis: Secondary | ICD-10-CM | POA: Diagnosis not present

## 2024-01-28 DIAGNOSIS — J3089 Other allergic rhinitis: Secondary | ICD-10-CM

## 2024-02-07 ENCOUNTER — Ambulatory Visit

## 2024-02-07 DIAGNOSIS — J302 Other seasonal allergic rhinitis: Secondary | ICD-10-CM | POA: Diagnosis not present

## 2024-02-11 ENCOUNTER — Ambulatory Visit
